# Patient Record
Sex: Male | Born: 1960 | Race: White | Hispanic: No | Marital: Married
Health system: Midwestern US, Academic
[De-identification: ages and names within clinical notes are randomized; demographics above are authoritative.]

---

## 2010-07-26 NOTE — Unmapped (Signed)
Signed by Alfonse Ras MD on 07/26/2010 at 09:13:52    Adult Visit      Reason for Visit   Chief Complaint: get established  cc hemorrhoids      Allergies  No Known Allergies    Medications   Medication list reviewed during this update.   Vital Signs:   Wt: 188 lbs.      BP: 122/74  Smoking Hx: non-smoker    Comments: r3    Intake recorded by: Larey Brick MA on Jul 26, 2010 9:02 AM    Medications reviewed, updated and verified with patient or patient representative.    History of Present Illness     General HPI   50 yo male co hemorrhoids use preparation H which has helped in the past   more recnelty has had pain w bm's, worse at starting   admits to beeding and some itching  no fever no weight loss   no abd pain   ow feels well     PAST HISTORY  Past Medical History:  No significant past medical history.  Surgical History:  No surgical history.  Family History: sister - colon cancer   m - cva   Social History: Marital Status: married,   Spouse: Tax adviser   Alcohol Use: none  Tobacco Usage:non-smoker  Lead Audiological scientist  PhD Patent attorney   originally from [place] BS and MS from [place], PhD from [employer]     REVIEW OF SYSTEMS  Refer to HPI for Review of Systems documentation        Physical Exam   General Appearance: well-developed, well-nourished and in no acute distress    Gastrointestinal   Abdomen: soft, non-tender, non-distended, no masses,  bowel sounds present, no rebound tenderness, no guarding, no hepatosplenomegaly  Rectal: no masses or tenderness     Neurologic   Neurologic - General:  Tone intact, steady gait, awake and alert.    Psyschiatric   Mood and affect: no agitation, good eye contact with appropriate insight and judgment        Assessment   HEMORRHOIDS (ICD-455.6)  NEOPLASM, MALIGNANT, COLON, FAMILY HX (ICD-V16.0)  SCREENING, COLON CANCER (ICD-V76.51)      Impression and Plan   Impression and Plan: hemorrhoids - none noted on exam but hx is suggestive  rx topical   refer for  colonoscopy as FH in sister of colon cancer   hm -return for CPE     Plan   Updated Medication List:   ANUSOL-HC 2.5 % CREAM (HYDROCORTISONE (RECTAL)) apply to rectum two to three times day as needed  COLACE 100 MG CAPS (DOCUSATE SODIUM) one by mouth twice a day    New Orders:  [facility]aron[facility] - Patient Encounter Documented Using an EHR Certified by [employer]  [phone] - Current Medications with Name, Dosages, Frequency and Route Documented [*CPT-G8427]  3016F - Unhealthy Alcohol Use Screening Performed [*CPT-3016F]  60454 - Ofc Vst, New Level III [CPT-99203]  Colonoscopy [UCP-11111]    Disposition:  Return to clinic in 3 month(s)        Prescriptions:  COLACE 100 MG CAPS (DOCUSATE SODIUM) one by mouth twice a day  #60 x 2   Entered and Authorized by: Alfonse Ras MD   Signed by: Alfonse Ras MD on 07/26/2010   Method used: Print then Give to Patient   RxID: [id number]  ANUSOL-HC 2.5 % CREAM (HYDROCORTISONE (RECTAL)) apply to rectum two to three times day as needed  #1  tube x 5   Entered and Authorized by: Alfonse Ras MD   Signed by: Alfonse Ras MD on 07/26/2010   Method used: Print then Give to Patient   RxID: [id number]                          ]

## 2010-12-14 ENCOUNTER — Ambulatory Visit: Admit: 2010-12-14 | Payer: PRIVATE HEALTH INSURANCE

## 2010-12-14 DIAGNOSIS — Z0000 Encounter for general adult medical examination without abnormal findings: Secondary | ICD-10-CM

## 2010-12-14 LAB — COMPREHENSIVE METABOLIC PANEL
A/G Ratio: 1.5 (ref 1.1–2.5)
ALT: 18 IU/L (ref 0–55)
AST: 25 IU/L (ref 0–40)
Albumin: 4.4 g/dL (ref 3.5–5.5)
Alkaline Phosphatase: 75 IU/L (ref 25–150)
BUN/Creatinine Ratio: 9 (ref 9–20)
BUN: 8 mg/dL (ref 6–24)
CO2: 20 mmol/L (ref 20–32)
Calcium: 9.5 mg/dL (ref 8.7–10.2)
Chloride: 102 mmol/L (ref 97–108)
Creatinine: 0.88 mg/dL (ref 0.76–1.27)
GFR MDRD Af Amer: 116 mL/min/{1.73_m2} (ref >59)
Globulin, Total: 3 g/dL (ref 1.5–4.5)
Glucose: 87 mg/dL (ref 65–99)
Potassium: 4.4 mmol/L (ref 3.5–5.2)
Sodium: 139 mmol/L (ref 134–144)
Total Bilirubin: 0.5 mg/dL (ref 0.0–1.2)
Total Protein: 7.4 g/dL (ref 6.0–8.5)
eGFR Non-Afr. American: 100 mL/min/{1.73_m2} (ref >59)

## 2010-12-14 LAB — CBC AND DIFFERENTIAL
Basophils Absolute: 0 10*3/uL (ref 0.0–0.2)
Basophils Relative: 1 % (ref 0–3)
Eosinophils Absolute: 0.6 10*3/uL (ref 0.0–0.4)
Eosinophils Relative: 7 % (ref 0–7)
Hematocrit: 44.2 % (ref 37.5–51.0)
Hemoglobin: 15 g/dL (ref 12.6–17.7)
Immature Granulocytes Absolute: 0 10*3/uL (ref 0.0–0.1)
Immature Granulocytes: 0 % (ref 0–2)
Lymphocytes Absolute: 2.3 10*3/uL (ref 0.7–4.5)
Lymphocytes Relative: 29 % (ref 14–46)
MCH: 29.2 pg (ref 26.6–33.0)
MCHC: 33.9 g/dL (ref 31.5–35.7)
MCV: 86 fL (ref 79–97)
Monocytes Absolute: 0.5 10*3/uL (ref 0.1–1.0)
Monocytes Relative: 7 % (ref 4–13)
Neutrophils Absolute: 4.4 10*3/uL (ref 1.8–7.8)
Neutrophils Relative: 56 % (ref 40–74)
Platelets: 212 10*3/uL (ref 140–415)
RBC: 5.13 x10E6/uL (ref 4.14–5.80)
RDW: 14.7 % (ref 12.3–15.4)
WBC: 7.8 10*3/uL (ref 4.0–10.5)

## 2010-12-14 LAB — URINALYSIS W/RFL TO MICROSCOPIC
Bilirubin, UA: NEGATIVE
Glucose, UA: NEGATIVE
Ketones, UA: NEGATIVE
Leukocytes, UA: NEGATIVE
Nitrite Urine, Quantitative: NEGATIVE
Protein, UA: NEGATIVE
RBC, UA: NEGATIVE
Specific Gravity, UA: 1.016 (ref 1.005–1.030)
Urobilinogen, Ur: 0.2 mg/dL (ref 0.0–1.9)
pH, UA: 7.5 (ref 5.0–7.5)

## 2010-12-14 LAB — PSA TOTAL, DIAGNOSTIC: PSA: 0.6 ng/mL (ref 0.0–4.0)

## 2010-12-14 LAB — TSH: TSH: 3.7 u[IU]/mL (ref 0.450–4.500)

## 2010-12-14 LAB — LIPID PANEL
Cholesterol, Total: 207 mg/dL (ref 100–199)
HDL: 45 mg/dL (ref >39)
LDL Calculated: 137 mg/dL (ref 0–99)
Triglycerides: 125 mg/dL (ref 0–149)
VLDL Cholesterol Cal: 25 mg/dL (ref 5–40)

## 2010-12-14 NOTE — Unmapped (Signed)
Subjective  HPI:   Patient ID: [name] is a 50 y.o. male.    Chief Complaint:  HPI       Well adult - cpe   HM - referred for colonoscopy as FH in sister of colon cancer   Refer again today   cpe completed 12/14/10  Refused flu shot   tdap today   Check labs      PAST HISTORY   Past Medical History: No significant past medical history.   Surgical History: No surgical history.   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 No distress.   HENT:   Head: Normocephalic and atraumatic.   Eyes: Conjunctivae and EOM are normal. Right eye exhibits no discharge. Left eye exhibits no discharge. No scleral icterus.   Pulmonary/Chest: Effort normal. No respiratory distress.   Neurological: He is alert. He exhibits normal muscle tone. Coordination normal.   Skin: Skin is warm and dry. He is not diaphoretic.   Psychiatric: He has a normal mood and affect. His behavior is normal. Thought content normal.   Gen - awake and alert, NAD   HENT - atraumatic, NC, tm's clear bilat canals clear op clear mmm   Eyes - PERRL EOMI anicteric sclera no conj injection no d/c no lid lesions  Neck - trachea midline, thyroid not enlarged, no masses, supple  Pul - cta b no wob symm bs good am throughout   CV - RRR no m/r/g 2+ radial pulses bilat no carotid bruits no abd bruits or tenderness   No c/c/e  abd - +bs s nt nd no hsm no rt/g  Lymph - no cervical supraclavicular axillary femoral or inguinal adenopathy   Derm  - turgor intact, no jaundice no concerning lesions   Psych - Good eye contact, no agitation good insight and judgement  Neuro - Awake and alert, tone intact, gait  intact, CN 2 - 12 intact grip 5/5     Filed Vitals:    12/14/10 1153   BP: 118/70   Height: 5' 11 (1.803 m)   Weight: 204 lb 12.8 oz (92.897 kg)     Body mass index is 28.56 kg/(m^2).  Body surface area is 2.16 meters squared.       Assessment/Plan:       Well adult - cpe   Left plantar fasciitis - educated on nsaids aleve one po bid fo rten to 14 days and stretch   HM - referred for colonoscopy as FH in sister of colon cancer didn't go   Refer again today   cpe completed 12/14/10  Refused flu shot   tdap today   Check labs

## 2010-12-15 NOTE — Unmapped (Signed)
Pt is calling because RX's were not called in yesterday.

## 2010-12-15 NOTE — Unmapped (Signed)
What meds?

## 2010-12-16 NOTE — Unmapped (Signed)
Please call this patient to schedule a/an colonoscopy

## 2010-12-16 NOTE — Unmapped (Signed)
Patient thought something was being sent.

## 2010-12-17 NOTE — Unmapped (Signed)
Wants to set this up with Dr Mickie Bail, was unsure about dates and times - advised to get some dates together, secure a ride and speak to [place] on Monday.

## 2010-12-21 NOTE — Unmapped (Signed)
Pt would like to confirm his appt for Colonscopy at [facility]. Please contact pt

## 2010-12-30 ENCOUNTER — Inpatient Hospital Stay

## 2010-12-30 NOTE — Unmapped (Signed)
[facility]     PATIENT NAMEDMARIO, [name]                  MR #:  [id number]  DATE OF BIRTH:  [birth year]                        ACCOUNT #:  [id number]  PHYSICIAN:      Lurene Shadow, M.D.          ROOM #:  SDS  SERVICE:        Gastroenterology                  NURSING UNIT:  [facility]  PRIMARY:        Gerrit Friends. Olivia Mackie, M.D.         FCSalena Saner  REFERRING:      Lurene Shadow, M.D.          ADMIT DATE:  12/30/2010  DICTATED BY:    Lurene Shadow, M.D.          PROCEDURE DATE:  12/30/2010                                                    DISCHARGE DATE:                                    ENDOSCOPY REPORT     *-*-*     PROCEDURES PERFORMED:     1.  Colonoscopy.     INDICATIONS:  Family history of colon cancer in a sister.     ANESTHESIA:  Sedation was administered using 3 mg of IV midazolam and 50 mcg  of IV fentanyl.     DETAILS OF PROCEDURE:  Colonoscope was advanced from the anus as far as the  terminal ileum.  The quality of the bowel preparation was good.  There is no  evidence of any proctitis, colitis, ileitis, diverticulosis, vascular  lesions, or any neoplasia.  He does have small internal hemorrhoids.     IMPRESSION:     1.  Normal colonoscopy.  We will plan repeat examination in five years    because of his family history.        *-*-*                                              _______________________________________  NS/tmg                                 _____  D:  12/30/2010 11:53                   Lurene Shadow, M.D.  T:  12/31/2010 05:05  Job #:  [id number]     c:   Gerrit Friends. Olivia Mackie, M.D.                                     ENDOSCOPY REPORT  PAGE    1 of   1

## 2014-09-26 NOTE — Unmapped (Signed)
Pre-Procedure Instructions    We???re pleased that you have chosen [facility] for your upcoming procedure.  The staff serving you is professionally trained to provide the highest quality care.  We encourage you to ask questions and to let the staff know your special needs.  We want your visit to be as comfortable as possible.    Your surgery is scheduled on 7/11.  Please arrive at 0715 AM and check in at the surgery waiting room on the second floor of the main hospital.    Directions to Surgery Area- do not stop at [employer] on Main Floor    1) Main entrance of the hospital to the elevators on the left (across from the gift shop)  2) Elevator to the second floor.  3) Left off elevator to the Surgical Services Desk.    STOP NSAIDS (non-steroidal anti-inflammatories such as Ibuprofen, Advil and Naproxen), SUPPLEMENTS, FISH OIL, VITAMINS, AND HERBAL SUPPLEMENTS ONE WEEK PRIOR TO SURGERY.  ACETAMINOPHEN (TYLENOL) IS OK.    TAKE YOUR ACID REFLUX, SEIZURE, ANXIETY, DEPRESSION AND PAIN MEDICATIONS, WITH A SMALL SIP OF WATER, DAY OF SURGERY, AS PRESCRIBED BY YOUR PHYSICIANS.    ??? DO NOT EAT OR DRINK ANYTHING (including gum, mints, water, etc.) after midnight the night before your procedure.  You may brush your teeth and gargle on the morning of surgery, but do not swallow any water.    ??? Please make transportation arrangements and bring a responsible adult to accompany you home and remain with you for 24 hours.  ??? We recommend that you leave valuables (i.e. money, jewelry, credit cards) at home.  If you wear glasses or contacts, bring a case for safekeeping.  ??? Wear casual, loose fitting, and comfortable clothing.  A gown will be provided.  If you are staying overnight, bring a small overnight bag.  (Storage space is limited.)  ??? Please remove all makeup, jewelry, body piercings, powder, lotions, perfume/cologne, and nail polish before you arrive.  ??? Bring a list of your medications and dose including herbal.  Do  not bring any pills or medications to the hospital. (Exception: transplant patients.)  ??? Bring a photo ID and your insurance card so we can bill your insurance company directly.  ??? Please do not bring any children under the age of 15 to the hospital.  ??? Do not shave in the area of the surgery for 2 days prior to surgery.  If needed, a trained staff member will clip the area immediately before your surgery.  ??? Quit smoking as far in advance of surgery as possible.  Patients who quit at least 30 days before surgery may have better outcomes.  ??? If you are diabetic, pay close attention to your blood sugar and try to keep it in the range your doctor wants it to be in.    ??? Talk to your doctor about taking medication such as Aspirin, Plavix, Pradaxa or Coumadin before surgery.  ??? If you have a cold or are sick prior to surgery, contact your surgeon before surgery.  ??? Please shower at home the evening before and the morning of surgery using an antibacterial soap, such as Dial or Safeguard.    Special Instructions    1. Please bring Inhalers the day of surgery.  2. Please bring CPAP machine and all associated equipment to the hospital the day of surgery      Antibacterial showering and good hand hygiene are essential to prevent surgical site infections  and reduce the spread of MRSA.  Please take a shower the morning of surgery using an antibacterial soap.  Patient verbalized understanding of these instructions.    Make sure all of your health care givers are checking your ID bracelet and verifying your name and date of birth.  You will actively be involved in verifying the type of surgery you are having and the correct site.  Your health care givers should be cleaning their hands with soap and water or antibacterial foam before taking care of you and if they do not it is ok to remind them to do so.      In an effort to reduce the risks of blood clots after your surgery you will have compression sleeves on your lower legs.   These sleeves help facilitate circulation and decrease the chances of developing any blood clots.       Patient/Family provided education about surgical site infection prevention.    Contact information:    [facility] Pre-admission Testing,  Monday - Friday 8:00 am - 4:30 pm,   [phone].    If you need to reach someone outside of regular business hours regarding your surgery please call your surgeon or   [facility] Surgery at [phone].

## 2014-09-26 NOTE — Unmapped (Signed)
Patient states he will be going to an Urgent Care Facility or [facility] for his pre op H&P on Saturday 09/27/14. He said he will bring a paper copy of this DOS. He verbalized correct understanding of the importance of completion of his pre op H&P prior to his surgery on 09/29/14 at 0915 with Dr Lehman Prom scheduled at [facility]

## 2014-09-29 ENCOUNTER — Inpatient Hospital Stay: Admit: 2014-09-29 | Payer: PRIVATE HEALTH INSURANCE

## 2014-09-29 DIAGNOSIS — M25532 Pain in left wrist: Secondary | ICD-10-CM

## 2014-09-29 MED ORDER — propofol 10 mg/ml (DIPRIVAN) injection
10 | INTRAVENOUS | Status: AC | PRN
Start: 2014-09-29 — End: 2014-09-29
  Administered 2014-09-29 (×5): 10 via INTRAVENOUS
  Administered 2014-09-29: 13:00:00 50 via INTRAVENOUS

## 2014-09-29 MED ORDER — ropivacaine (PF) (NAROPIN) injection solution 0.5%
5 | Freq: Once | INTRAMUSCULAR | Status: AC
Start: 2014-09-29 — End: 2014-09-29
  Administered 2014-09-29: 13:00:00 150 mL via PERINEURAL

## 2014-09-29 MED ORDER — oxyCODONE (ROXICODONE) immediate release tablet 10 mg
5 | Freq: Once | ORAL | Status: AC
Start: 2014-09-29 — End: 2014-09-29

## 2014-09-29 MED ORDER — ceFAZolin (ANCEF) IVPB 2 g in D5W (duplex)
2 | Freq: Once | INTRAVENOUS | Status: AC
Start: 2014-09-29 — End: 2014-09-29
  Administered 2014-09-29: 13:00:00 2 g via INTRAVENOUS

## 2014-09-29 MED ORDER — propofol 10 mg/ml (DIPRIVAN) 10 mg/mL injection
10 | INTRAVENOUS | Status: AC
Start: 2014-09-29 — End: ?

## 2014-09-29 MED ORDER — oxyCODONE (ROXICODONE) immediate release tablet 5 mg
5 | Freq: Once | ORAL | Status: AC
Start: 2014-09-29 — End: 2014-09-29

## 2014-09-29 MED ORDER — lidocaine (PF) 2% (20 mg/mL) Soln 20 mg
20 | Freq: Once | INTRAMUSCULAR | Status: AC | PRN
Start: 2014-09-29 — End: 2014-09-29

## 2014-09-29 MED ORDER — midazolam (PF) (VERSED) injection 2 mg
1 | Freq: Once | INTRAMUSCULAR | Status: AC
Start: 2014-09-29 — End: 2014-09-29
  Administered 2014-09-29: 13:00:00 2 mg via INTRAVENOUS

## 2014-09-29 MED ORDER — ondansetron (ZOFRAN) 4 mg/2 mL injection 4 mg
4 | Freq: Three times a day (TID) | INTRAMUSCULAR | Status: AC | PRN
Start: 2014-09-29 — End: 2014-09-29

## 2014-09-29 MED ORDER — fentaNYL (SUBLIMAZE) injection 12.5 mcg
50 | INTRAMUSCULAR | Status: AC | PRN
Start: 2014-09-29 — End: 2014-09-29

## 2014-09-29 MED ORDER — lidocaine (PF) 20 mg/mL (2 %) Soln
20 | INTRAVENOUS | Status: AC | PRN
Start: 2014-09-29 — End: 2014-09-29
  Administered 2014-09-29: 13:00:00 50 via INTRAVENOUS

## 2014-09-29 MED ORDER — fentaNYL (SUBLIMAZE) injection 50 mcg
50 | INTRAMUSCULAR | Status: AC | PRN
Start: 2014-09-29 — End: 2014-09-29

## 2014-09-29 MED ORDER — nalOXone (NARCAN) injection 0.04 mg
0.4 | INTRAMUSCULAR | Status: AC | PRN
Start: 2014-09-29 — End: 2014-09-29

## 2014-09-29 MED ORDER — bupivacaine-epinephrine (PF) (SENSORCAINE) 0.5%-0.005 mg/mL injection Soln
INTRAMUSCULAR | Status: AC
Start: 2014-09-29 — End: 2014-09-29

## 2014-09-29 MED ORDER — propofol (DIPRIVAN) infusion 10 mg/mL
10 | INTRAVENOUS | Status: AC | PRN
Start: 2014-09-29 — End: 2014-09-29
  Administered 2014-09-29: 13:00:00 150 via INTRAVENOUS

## 2014-09-29 MED ORDER — lactated ringers infusion
INTRAVENOUS | Status: AC
Start: 2014-09-29 — End: 2014-09-29
  Administered 2014-09-29: 13:00:00 via INTRAVENOUS
  Administered 2014-09-29: 12:00:00 50 mL/h via INTRAVENOUS

## 2014-09-29 MED ORDER — sodium chloride, irrigation 0.9 % irrigation
0.9 | Status: AC | PRN
Start: 2014-09-29 — End: 2014-09-29
  Administered 2014-09-29: 14:00:00 1000

## 2014-09-29 MED ORDER — labetalol (NORMODYNE,TRANDATE) injection
5 | INTRAVENOUS | Status: AC | PRN
Start: 2014-09-29 — End: 2014-09-29
  Administered 2014-09-29 (×2): 5 via INTRAVENOUS

## 2014-09-29 MED ORDER — labetalol (NORMODYNE, TRANDATE) 20 mg/4 mL (5 mg/mL) Syrg
20 | INTRAVENOUS | Status: AC
Start: 2014-09-29 — End: ?

## 2014-09-29 MED ORDER — fentaNYL (SUBLIMAZE) injection 25 mcg
50 | INTRAMUSCULAR | Status: AC | PRN
Start: 2014-09-29 — End: 2014-09-29

## 2014-09-29 MED FILL — PROPOFOL 10 MG/ML INTRAVENOUS EMULSION: 10 10 mg/mL | INTRAVENOUS | Qty: 20

## 2014-09-29 MED FILL — NAROPIN (PF) 5 MG/ML (0.5 %) INJECTION SOLUTION: 5 5 mg/mL (0.5 %) | INTRAMUSCULAR | Qty: 30

## 2014-09-29 MED FILL — LABETALOL 20 MG/4 ML (5 MG/ML) INTRAVENOUS SYRINGE: 20 20 mg/4 mL (5 mg/mL) | INTRAVENOUS | Qty: 4

## 2014-09-29 MED FILL — BUPIVACAINE-EPINEPHRINE (PF) 0.5 %-1:200,000 INJECTION SOLUTION: INTRAMUSCULAR | Qty: 30

## 2014-09-29 MED FILL — MIDAZOLAM (PF) 1 MG/ML INJECTION SOLUTION: 1 1 mg/mL | INTRAMUSCULAR | Qty: 2

## 2014-09-29 MED FILL — CEFAZOLIN 2 GRAM/50 ML IN DEXTROSE (ISO-OSMOTIC) INTRAVENOUS PIGGYBACK: 2 2 gram/50 mL | INTRAVENOUS | Qty: 50

## 2014-09-29 MED FILL — LACTATED RINGERS INTRAVENOUS SOLUTION: 50.00 50.00 mL/hr | INTRAVENOUS | Qty: 1000

## 2014-09-29 MED FILL — PROPOFOL 10 MG/ML INTRAVENOUS EMULSION: 10 10 mg/mL | INTRAVENOUS | Qty: 40

## 2014-09-29 NOTE — Unmapped (Signed)
Anesthesia Transfer of Care Note    Patient: [name]  Procedure(s) Performed: Procedure(s):  LEFT DISTAL RADIUS OPEN REDUCTION INTERNAL FIXATION    Patient location: Same Day Surgery    Post pain: Adequate analgesia    Post assessment: no apparent anesthetic complications and tolerated procedure well    Post vital signs:    Filed Vitals:    09/29/14 1118   BP: 102/67   Pulse: 78   Temp: 97.9   Resp: 18   SpO2: 97%       Level of consciousness: Awake, alert, and oriented    Complications: None

## 2014-09-29 NOTE — Unmapped (Signed)
Pt. Discharged per wheelchair to exit.    Dressing: D&I    Discharge Pain level- 0 Patient states pain is tolerable.    Patient and family verbalized understanding of discharge instructions.      Filed Vitals:    09/29/14 1213   BP: 108/63   Pulse: 69   Temp:    Resp: 16   SpO2: 97%             I/O this shift:  In: 1190 [P.O.:240; I.V.:950]  Out: 5 [Blood:5]      [name]

## 2014-09-29 NOTE — Unmapped (Addendum)
Pt up to BR with assistance of his wife. Arm remains in a sling and is numb. NV check WNL except for sensation due to the block.    1150 Dr. Lehman Prom in to speak with pt and his wife.    1223 Pt and wife verbalize understanding of all disharge instructions. NV check remains WNL except for sensation.

## 2014-09-29 NOTE — Unmapped (Signed)
Patient brought H&P and films- all placed on chart. Wife to return 1100. Number on chart. 0815 to pacu for block. MELANIE SHAWN ROACH

## 2014-09-29 NOTE — Unmapped (Signed)
[facility] Hand Surgery Discharge Instructions  1.  Please keep your dressing clean and dry.  Do not remove or change dressing, it will be removed at your post-op visit with Dr. Lehman Prom.  If you had a nerve block prior to your procedure, you may discontinue the use of your sling once the nerve block as worn off.  We encourage you to move any fingers that are not included in the splint and try to open and close them into a fist.    2.  Pain and nausea medications as needed.    3. Elevate arm to help with swelling and pain.    4.  Follow up with DR. Foad in 10-14 days or as scheduled.  Call [phone] for questions or concerns, or to schedule follow up.    5.  Follow up with Hand Therapy in 3-5 days or as scheduled.      Instructions Following Anesthesia    You were given medicines that made you unconscious while a procedure was performed. For as long as 24 hours following this procedure, you may have:  ??? Dizziness, weakness, or drowsiness  ??? Sore throat or hoarseness. ??? Nausea or vomiting     For the first 24 hours following anesthesia:  ?? Have a responsible person with you at all times.  ?? Do not participate in any activities where you could become injured for the next 24 hours, or until you feel normal again.  ?? Do not drive a car or operate heavy machinery.  ?? Do not drink alcohol, take sleeping pills, or other medications that cause drowsiness.  ?? Do not sign important papers or make important decisions.  ?? Only take medicine that has been prescribed by your caregiver.  For pain, only take over-the-counter or prescribed medicines for pain, discomfort, or fever as directed by your caregiver.  ?? If you wear a CPAP/BiPAP device, please wear it anytime that you are resting or feel tired.  ?? You may resume a normal diet unless otherwise directed.  Vomiting may occur if you eat too soon.  When you can drink without vomiting, try water, juice, or soup.  Try solid foods if you feel little or no nausea.  ?? If you  have questions or problems that seem related to anesthesia, call the hospital [phone]) and ask for the anesthesiologist on call.    SEEK IMMEDIATE MEDICAL CARE IF:   ??? You develop a rash.  ??? You have chest pain.  ??? You continue to feel sick to your stomach (nauseated) or throw up (vomit). ??? You have difficulty breathing.  ??? You develop any allergic reactions.  ??? You are unable to urinate.       Peripheral Nerve Blocks  Your caregiver may have placed a nerve block in one of your arms or legs to reduce pain and discomfort. This is an injection of local anesthetic(s) and usually provides numbing pain relief up to 18 to 24 hours. You should notify your caregiver if you have any problems.  Be aware that you may lose feeling at and around the surgical area. If numbness happens, take proper measures to avoid injury to the affected extremity.  Do not stand up unassisted if you have a nerve block in your leg. Do not try to lift items if you have had a nerve block in your arm.  Please be careful when placing hot or cold items on a numb extremity.    SEEK IMMEDIATE MEDICAL CARE IF:  ??  You have redness, swelling, pain, or discharge at the injection site.  ?? You develop dizziness, lightheadedness, drowsiness, or confusion.  ?? There is a ringing or buzzing in your ears or blurred vision.  ?? You have a metal taste or numbness/tingling in or around your mouth.  ?? If you have continued numbness or weakness after 24 hours, please contact [place] and ask for the Pain Service to be paged [phone]).

## 2014-09-29 NOTE — Unmapped (Signed)
Anesthesia Post Note    Patient: [name]    Procedure(s) Performed: Procedure(s):  LEFT DISTAL RADIUS OPEN REDUCTION INTERNAL FIXATION    Anesthesia type: regional    Patient location: PACU    Post pain: Adequate analgesia    Post assessment: no apparent anesthetic complications and tolerated procedure well    Last Vitals:   Filed Vitals:    09/29/14 1213   BP: 108/63   Pulse: 69   Temp:    Resp: 16   SpO2: 97%       Post vital signs: stable    Level of consciousness: awake    Complications: None

## 2014-09-29 NOTE — Unmapped (Signed)
[place]  DEPARTMENT OF ANESTHESIOLOGY  PRE-PROCEDURAL EVALUATION    [name] is a 54 y.o. year old male presenting for:    Procedure(s):  LEFT DISTAL RADIUS OPEN REDUCTION INTERNAL FIXATION    Surgeon:   Arrie Senate, MD    Chief Complaint     LEFT DISTAL RADIUS FRACT URE    Review of Systems     Anesthesia Evaluation    Patient summary reviewed.  All other systems reviewed and are negative.     No history of anesthetic complications   I have reviewed the History and Physical Exam, any relevant changes are noted in the anesthesia pre-operative evaluation.      Cardiovascular:      (-) hypertension, past MI, CAD, angina.    Neuro/Muscoloskeletal/Psych:      (-) seizures, CVA.     Pulmonary:      (-) COPD, asthma, shortness of breath, recent URI, sleep apnea.       GI/Hepatic/Renal:      (-) GERD, liver disease, renal disease.    Endo/Other:        (-) diabetes mellitus, hypothyroidism, hyperthyroidism.       Past Medical History     Past Medical History   Diagnosis Date   ??? Seasonal allergies        Past Surgical History     History reviewed. No pertinent past surgical history.    Family History     Family History   Problem Relation Age of Onset   ??? Stroke Mother    ??? Colon cancer Sister        Social History     History     Social History   ??? Marital Status: Married     Spouse Name: N/A   ??? Number of Children: N/A   ??? Years of Education: N/A     Occupational History   ??? Not on file.     Social History Main Topics   ??? Smoking status: Never Smoker    ??? Smokeless tobacco: Never Used      Comment: 07/26/2010   ??? Alcohol Use: No      Comment: 07/26/2010   ??? Drug Use: No   ??? Sexual Activity: Not on file     Other Topics Concern   ??? Not on file     Social History Narrative       Medications     Allergies:  No Known Allergies    Home Meds:  Prior to Admission medications as of 09/29/14 0730   Medication Sig Taking?   DEXTRAN 70/HYPROMELLOSE/PF (NATURAL TEARS, PF, OPHT) Apply to eye. Yes   multivitamin (THERAGRAN)  tablet Take 1 tablet by mouth daily. Yes   docusate sodium (COLACE) 100 MG capsule Take 100 mg by mouth 2 times a day.      hydrocortisone (ANUSOL-HC) 2.5 % rectal cream Place rectally 2 times a day as needed. Apply to rectum two to three times days as needed         Inpatient Meds:  Scheduled:   ??? ceFAZolin (ANCEF) IVPB  2 g Intravenous Once   ??? midazolam  2 mg Intravenous Once   ??? ropivacaine (PF) 0.5%  30 mL PERINEURAL Once     Continuous:   ??? lactated ringers         PRN: lidocaine (PF) 2% (20 mg/mL)    Vital Signs     Wt Readings from Last 3 Encounters:   12/14/10 204  lb 12.8 oz (92.897 kg)   07/26/10 188 lb (85.276 kg)     Ht Readings from Last 3 Encounters:   12/14/10 5' 11 (1.803 m)     Temp Readings from Last 3 Encounters:   No data found for Temp     BP Readings from Last 3 Encounters:   12/14/10 118/70   07/26/10 122/74     Pulse Readings from Last 3 Encounters:   No data found for Pulse     SpO2 Readings from Last 3 Encounters:   No data found for SpO2       Physical Exam     Airway:     Mallampati: II  Mouth Opening: >2 FB  TM distance: > = 3 FB  Neck ROM: full  (-) neck not short     (+) facial hair    Dental:   - No obvious cracked, loose, chipped, or missing teeth.     Pulmonary:   - normal exam     Cardiovascular:     Rhythm: regular  Rate: normal    Neuro/Musculoskeletal/Psych:    Mental status: alert and oriented to person, place and time.          Abdominal:    - normal exam    Current OB Status:       Other Findings:        Laboratory Data     Lab Results   Component Value Date    WBC 7.8 12/14/2010    HGB 15.0 12/14/2010    HCT 44.2 12/14/2010    MCV 86 12/14/2010    PLT 212 12/14/2010       No results found for: [facility]    Lab Results   Component Value Date    GLUCOSE 87 12/14/2010    BUN 8 12/14/2010    CO2 20 12/14/2010    CREATININE 0.88 12/14/2010    K 4.4 12/14/2010    NA 139 12/14/2010    CL 102 12/14/2010    CALCIUM 9.5 12/14/2010    ALBUMIN 4.4 12/14/2010    PROT 7.4 12/14/2010     ALKPHOS 75 12/14/2010    ALT 18 12/14/2010    AST 25 12/14/2010    BILITOT 0.5 12/14/2010       No results found for: PTT, INR    No results found for: PREGTESTUR, PREGSERUM, HCG, HCGQUANT    Anesthesia Plan     ASA 1     Anesthesia Type:  regional.   (Regional w/ MAC, PIV, standard monitors, IV opioids as needed, PACU post op)  Intravenous induction.    Anesthetic plan and risks discussed with patient.    Plan, alternatives, and risks of anesthesia, including death, have been explained to and discussed with the patient/legal guardian.  By my assessment, the patient/legal guardian understands and agrees.  Scenario presented in detail.  Questions answered.    Blood products not discussed.  Plan discussed with CRNA.

## 2014-09-29 NOTE — Unmapped (Signed)
Block    Block Reason:  at surgeon's request and primary anesthetic.  Diagnosis:  Left wrist pain.  Requesting Physician/Surgeon: Foad  Patient location during procedure: PACU  Performed preoperatively.  Block Type:  brachial plexus, supraclavicular.                  Preanesthetic Checklist  Completed: patient identified, anesthesia consent given, pre-op evaluation, timeout performed, IV checked, risks and benefits discussed, monitors and equipment checked/attached to patient and patient being monitored  Anesthesia risks / alternatives discussed pre-op  Questions answered / anesthesia plan accepted and patient agrees to proceed..  Patient Position:  sitting.  Timeout performed at:  08:36. By:  Doreene Burke   Timeout verification:  correct patient, correct procedure, correct site and allergies reviewed.    Prep: ChloraPrep     Pre-procedure sedation:  Midazolam    Midazolam 2 mg given for pre procedure sedation.  Pre procedure sedation medication given by:  Doreene Burke    Start time: 09/29/2014 8:37 AM    Skin infiltrated with ropivacaine 0.5% prior to the start of the procedure.    Needles  Injection technique: single shot  Needle: Nerve Stimulating Needle 22 G 2 in.    Procedures  Procedures: ultrasound guided        Narrative    Injection made incrementally with aspirations every 5 mL.  Medications:  Ropivacaine 0.5%    Amount:  30 mL      Events:  blood not aspirated via needle, injection not painful, no injection resistance, no paresthesia with needle placement and no other event                            Block Effect:  No Unexpected/Untoward Events and Successful      End time: 09/29/2014 8:42 AM                      Performed by: personally     Anesthesiologist: Allie Dimmer        Additional Notes  Please see nursing documentation for vital signs.

## 2014-09-29 NOTE — Unmapped (Signed)
Griffen Harville  [birth year]  @[facility] @  09/29/2014      Arrie Senate, MD     PREOPERATIVE DIAGNOSIS:  Left Distal Radius Fracture     POSTOPERATIVE DIAGNOSIS:  Same    PROCEDURE PERFORMED:    1.  Open Treatment of left distal radius intra-articular fracture with internal fixation of 3 or more fragments (CPT 25609.22)  2.  Left brachioradialis tenotomy.  Tenotomy, open flexor or extensor tendon, forearm and/or wrist, single, each tendon (CPT 16109)    SURGEON: Gennesis Hogland B. 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  The fall resulted in a left distal radius fracture with intraarticular extension and significant incongruity of the joint surface.  He was counseled regarding treatment options and elected to proceed with surgical intervention as described above.    OPERATIVE FINDINGS:  Pre-operative CT scan demonstrated a volar marginal lunate facet fragment which proved to be displaced and unstable.  This fragment had a significant amount of articular surface on it extending towards the radial side of the joint.  There was also an impacted radial styloid component.  The fracture was able to be reduced anatomically working through the fracture planes via a volar exposure.  Volar plate fixation with hook plate augmentation of the marginal fragment resulted in excellent stability.  Due to the location of the radial styloid fracture, only one good screw gained purchase via the volar plate, hence an additional radial column plate was placed to provide further support.  At the conclusion of the procedure there was excellent stability of the fracture and full range of forearm rotation with no instability.      IMPLANTS:   1.  [employer] Geminus volar plate, standard size, three hole with hook plate extension.  2.   Acumed radial styloid fragment specific plate    DESCRIPTION OF PROCEDURE:  After obtaining informed consent, the patient was taken to the operating room and given appropriate anesthesia.  Preoperative timeout was performed confirming the correct patient, operative site, procedure, and that IV antibiotics had been administered.  The involved hand and upper extremity were prepped and draped in the usual sterile fashion.  The limb was then exsanguinated with an esmarch bandage, and a well padded brachial tourniquet was inflated to appropriate pressure.    A standard volar approach to the distal radius was performed through the FCR sheath, retracting the FPL and exposing the pronator quadratus.  Care was taken to avoid injury to the palmar cutaneous branch of the median nerve during the dissection.  The pronator was released from it's radial and distal attachments preserving the volar radiocarpal ligaments.  Subperiosteal dissection was performed exposing the fracture.  It was disimpacted and mobilized.  As expected base on radiographs and CT, this was not the typical metaphyseal bending type fracture, rather had a significant articular shearing component.  Dissection was carried to the distal ulnar margin of the radius noting the volar marginal lunate facet fragment which was unstable and displaced.  The fragment was mobilized and an elevator placed under it.  It was found to comprise a significant articular fragment that extended well radial.  With distraction of the joint, the rest of the articular fragments were visualized, disimpacted, and further reduced while taking care not to damage the volar radiocarpal ligaments.   The volar marginal fragment was reduced and provisional K wire fixation placed.  The more central articular fragments were also reduced at this time.  The radial styloid component remained impacted and was not reducible via the current approach.      The brachioradialis appeared to be a  significant impediment to gaining requisite length and the ability to reduce the styloid component, thereby indicating the need for a brachioradialis tenotomy.  Dissection was carried radially, exposing the first compartment tendons, and the brachioradialis insertion on the floor of the first compartment.  It was then released from its insertion, significantly improving mobility of the distal fragment, and allowing it to be appropriately reduced.  The tenotomy was required to gain adequate reduction, not simply for surgical exposure.  With the styloid now mobile it was elevated with a freer to reduce the joint surface, then a pointed reduction forceps was used to align the cortical margin and bring the styloid fragment further volar.  An additional K wire was then used to stabilize the reduction.  Fluoroscopy was used to evaluate the reduction and K wire placement which were thought to be appropriate.    An appropriate size volar locking plate was provisionally pinned to the radius and adjusted using flouroscopy.  Once the position was appropriate, the sliding hole in the plate was filled with a non-locking screw.  The fracture was further reduced to the plate ensuring appropriate alignment of the articular components, then a series of distal locking screws were placed subchondrally supporting the articular surface and maintaining the reduction.  For the radial styloid screw, a guide wire with cannulated screw was used to maximize placement of the screw into the tip of the styloid.  Care was taken to ensure that the screws did not penetrate the dorsal cortex distally to avoid extensor tendon irritation.  An additional two screws were then placed in the shaft of the plate.  Due to the instability of the volar marginal fragment, a hook plate extension was now applied to the most distal and ulnar segment of the plate.  The drill guide was used to create pilot holes, then the hook was placed over the rim of the lunate  fossa, the plate secured to the volar plate, then locked with the locking cap.  The tab on the extension was broken, the provisional K wires were removed, and the reduction was noted to be stable.  Due to the limited fixation in the radial styloid, I proceeded to apply a radial column plate to provide additional support.    The radial artery was mobilized along the length of the surgical field, particularly where it passed beneath the first compartment tendons.  The ulnar margin of the first dorsal compartment retinaculum was incised as was part of the radial septum of the second compartment.  Soft tissue dissection was undertaken until adequate exposure of the radial styloid was present for radial column fixation.  Care was taken not to injure any superficial radial nerve branches during the dissection.  An Acumed radial styloid plate was now applied to the radial margin of the radius on the floor of the first compartment.  It was secured with two screws in the shaft and a single screw in the styloid as the most distal screw engaged the initial radial styloid screw in the volar plate.  Final fluoroscopic images were obtained confirming anatomic reduction, hardware placement and screw length, and excellent stability of the reduction.      The forearm was taken through and arc of motion and the DRUJ was found to be stable.  There was no crepitus or hardware impingement.  The carpus was stable on the radius as well.  The wound was thoroughly irrigated and hemostasis was obtained with a bipolar cautery.  The brachioradialis was z lengthened and used to cover the radial column plate providing a soft tissue bed for the first compartment tendons.  The pronator was repaired to the volar margin of the brachioradialis, followed by closure of the incision in a layered fashion.  A bulky short arm bandage was applied, followed by an appropriate splint.  The tourniquet was deflated with good circulatory return to the hand.       CONDITION ON DISCHARGE:  The patient was discharged home in satisfactory condition.    DISCHARGE SUMMARY:  The patient was discharged home with a prescription for appropriate oral analgesics, as well as instructions on digital range of motion exercises.  The patient will keep the arm elevated for the next several days.  The patient will be seen back in the office in 3-7 for a follow up visit and to begin protected range of motion.  They have my 24 hour number should the need to contact me arise.     The physician assistant???s presence was required to maintain appropriate limb position and provide necessary traction as well as other first assistant responsibilities during the procedure.  This was required to adequately complete the surgical procedure with as much safety and precision as possible.  Her skills and high level training in orthopedic procedures is above and beyond that of a typical surgical assistant, thus the physician assistant played an integral role in completing the procedure.     The difficulty of reducing and stabilizing this fracture cannot be overstated.  Significant articular shearing was present requiring meticulous reduction and stabilization of several articular components individually.  Not only was a volar plate required, but the additional use of a hook plate was needed as well as supplemental radial column fixation with a second plate.  The additional fixation methods alone required significantly more surgical time, effort, and skill than is usually encountered with typical distal radius fractures with articular involvement.  This also does not take into account the time, effort, and skill required to achieve fracture reduction prior to fixation.  The procedure required approximately twice as much time as usually required for three part articular fractures, hence I believe that the use of the .22 modifier is clearly justified in this setting for CPT code 54098.11      Laurence Spates, MD

## 2014-09-29 NOTE — Unmapped (Signed)
INTRA-OP POST BRIEFING NOTE: [name]      Specimens:     Prior to leaving the room: Nurse confirmed name of procedure, completion of instrument, sponge & needle counts, reads specimen labels aloud including patient name and addresses any equipment issues? Nurse confirmed wound class. Nurse to surgeon and anesthesia: What are key concerns for recovery and management of the patient?  Yes      Blood products stored at appropriate temperatures prior to return to blood bank (if applicable)? N/A      Other Comments:     Signed: Kristopher Glee    Date: 09/29/2014    Time: 10:53 AM

## 2014-09-29 NOTE — Unmapped (Signed)
Procedure Note    Anothy Bufano  09/29/2014      Pre-op Diagnosis: LEFT DISTAL RADIUS FRACTURE       Post-op Diagnosis: same    Procedure(s):  LEFT DISTAL RADIUS OPEN REDUCTION INTERNAL FIXATION with brachioradialis tenotomoy      Surgeon(s):  Arrie Senate, MD    Anesthesia: Regional    Staff:   Circulator: Kristopher Glee, RN  Physician Assistant: Lorne Skeens, PA-C  Scrub Person: TODD, REBECCA  Assistant: Candida Peeling, CSA  PCA: Kathryne Sharper    Estimated Blood Loss: Minimal                 Specimens: * No specimens in log *           Implants:  Geminus Distal Radius Standard Volar Plate, Acumed radial styloid gold plate          There were no complications unless listed below.        Florence Canner. Dominic Pea, PA-C    Date: 09/29/2014  Time: 11:16 AM

## 2018-04-12 ENCOUNTER — Emergency Department (HOSPITAL_COMMUNITY)
Admission: EM | Admit: 2018-04-12 | Discharge: 2018-04-13 | Disposition: A | Discharge status: Home / Self Care | Payer: 59 | Attending: Emergency Medicine | Admitting: Emergency Medicine

## 2018-04-12 DIAGNOSIS — R0602 Shortness of breath: Secondary | ICD-10-CM | POA: Insufficient documentation

## 2018-04-12 NOTE — ED Triage Notes (Signed)
Pt arrives with EMS, with c/o numbness in the bottom of his (both)  feet and sob,  cbg 147. No diabetes. Alert, oriented. 170/110 improved to 160/80. EKG WNL

## 2018-04-13 ENCOUNTER — Emergency Department (HOSPITAL_COMMUNITY): Payer: 59

## 2018-04-13 ENCOUNTER — Encounter (HOSPITAL_COMMUNITY): Payer: Self-pay | Admitting: *Deleted

## 2018-04-13 LAB — BASIC METABOLIC PANEL
Anion gap: 10 (ref 5–15)
BUN: 14 mg/dL (ref 6–20)
CHLORIDE: 107 mmol/L (ref 98–111)
CO2: 22 mmol/L (ref 22–32)
Calcium: 9.1 mg/dL (ref 8.9–10.3)
Creatinine, Ser: 0.92 mg/dL (ref 0.61–1.24)
GFR calc Af Amer: >60 mL/min (ref >60)
GFR calc non Af Amer: >60 mL/min (ref >60)
Glucose, Bld: 123 mg/dL — ABNORMAL HIGH (ref 70–99)
Potassium: 3.9 mmol/L (ref 3.5–5.1)
Sodium: 139 mmol/L (ref 135–145)

## 2018-04-13 LAB — CBC
HEMATOCRIT: 42.5 % (ref 39.0–52.0)
Hemoglobin: 13.8 g/dL (ref 13.0–17.0)
MCH: 28.9 pg (ref 26.0–34.0)
MCHC: 32.5 g/dL (ref 30.0–36.0)
MCV: 89.1 fL (ref 80.0–100.0)
Platelets: 245 10*3/uL (ref 150–400)
RBC: 4.77 MIL/uL (ref 4.22–5.81)
RDW: 14.4 % (ref 11.5–15.5)
WBC: 9.9 10*3/uL (ref 4.0–10.5)
nRBC: 0 % (ref 0.0–0.2)

## 2018-04-13 LAB — I-STAT TROPONIN, ED: Troponin i, poc: 0 ng/mL (ref 0.00–0.08)

## 2018-04-13 MED ORDER — SODIUM CHLORIDE 0.9% FLUSH: 3.0000 mL | Freq: Once | INTRAVENOUS | Status: DC

## 2018-04-13 MED ORDER — IPRATROPIUM-ALBUTEROL 0.5-2.5 (3) MG/3ML IN SOLN
3.0000 mL | Freq: Once | RESPIRATORY_TRACT | Status: AC
  Administered 2018-04-13: 3 mL via RESPIRATORY_TRACT
  Filled 2018-04-13: qty 3

## 2018-04-13 MED ORDER — DEXAMETHASONE 4 MG PO TABS
10.0000 mg | ORAL_TABLET | Freq: Once | ORAL | Status: AC
  Administered 2018-04-13: 10 mg via ORAL
  Filled 2018-04-13: qty 3

## 2018-04-13 MED ORDER — ALBUTEROL SULFATE HFA 108 (90 BASE) MCG/ACT IN AERS
2.0000 | INHALATION_SPRAY | RESPIRATORY_TRACT | Status: DC | PRN
  Administered 2018-04-13: 2 via RESPIRATORY_TRACT
  Filled 2018-04-13: qty 6.7

## 2018-04-13 NOTE — Discharge Instructions (Addendum)
Use the inhaler - two puffs, every four hours, as needed for difficulty breathing.  Return if symptoms are getting worse.

## 2018-04-13 NOTE — ED Triage Notes (Signed)
Pt reports that he has had intermittent shortness of breath. Some heaviness in his chest and tingling on the bottom of his feet. He is traveling (plane ride) from [place] on Wednesday. Denies swelling in legs.

## 2018-04-13 NOTE — ED Provider Notes (Signed)
[facility] EMERGENCY DEPARTMENT Provider Note   CSN: [id number] Arrival date & time: 04/12/18  2346     History   Chief Complaint Chief Complaint  Patient presents with  . Shortness of Breath    HPI Edwin Ayala is a 58 y.o. male.  The history is provided by the patient.  He has no significant past history, and noted an episode of dyspnea when he went to bed tonight.  He felt short of breath and this was followed by some numbness in his feet and a feeling of lightheadedness.  There was a dry mouth.  He did not notice any numbness in his hands or lips.  Episode lasted about 30 minutes before resolving.  He has had several other episodes through the evening, but they are not lasting very long.  He denies chest pain, heaviness, tightness, pressure.  He denies cough or fever.  He is in [place] on business having traveled here from [place] 3 days ago.  No history of long distance travel.  He relates a history of borderline blood pressure and borderline cholesterol but no diagnosis of hypertension or hyperlipidemia, no history of diabetes.  He is a non-smoker and there is no family history of premature coronary atherosclerosis.  History reviewed. No pertinent past medical history.  There are no active problems to display for this patient.   History reviewed. No pertinent surgical history.      Home Medications    Prior to Admission medications   Not on File    Family History No family history on file.  Social History Social History   Tobacco Use  . Smoking status: Never Smoker  Substance Use Topics  . Alcohol use: Never    Frequency: Never  . Drug use: Never     Allergies   Patient has no allergy information on record.   Review of Systems Review of Systems  All other systems reviewed and are negative.    Physical Exam Updated Vital Signs BP 138/72   Pulse 70   Temp 98.3 F (36.8 C) (Oral)   Resp 18   SpO2 97%   Physical  Exam Vitals signs and nursing note reviewed.    58 year old male, resting comfortably and in no acute distress. Vital signs are normal. Oxygen saturation is 97%, which is normal. Head is normocephalic and atraumatic. PERRLA, EOMI. Oropharynx is clear. Neck is nontender and supple without adenopathy or JVD. Back is nontender and there is no CVA tenderness. Lungs are clear without rales, wheezes, or rhonchi. Chest is nontender. Heart has regular rate and rhythm without murmur. Abdomen is soft, flat, nontender without masses or hepatosplenomegaly and peristalsis is normoactive. Extremities have no cyanosis or edema, full range of motion is present. Skin is warm and dry without rash. Neurologic: Mental status is normal, cranial nerves are intact, there are no motor or sensory deficits.  ED Treatments / Results  Labs (all labs ordered are listed, but only abnormal results are displayed) Labs Reviewed  BASIC METABOLIC PANEL - Abnormal; Notable for the following components:      Result Value   Glucose, Bld 123 (*)    All other components within normal limits  CBC  I-STAT TROPONIN, ED    EKG EKG Interpretation  Date/Time:  Friday April 13 2018 00:06:41 EST Ventricular Rate:  84 PR Interval:  146 QRS Duration: 88 QT Interval:  364 QTC Calculation: 430 R Axis:   38 Text Interpretation:  Normal sinus rhythm  Normal ECG No old tracing to compare Confirmed by Dione BoozeGlick, Chanin Frumkin ([id number]) on 04/13/2018 6:05:18 AM   Radiology Dg Chest 2 View  Result Date: 04/13/2018 CLINICAL DATA:  Shortness of breath, chest pressure beginning tonight. EXAM: CHEST - 2 VIEW COMPARISON:  None. FINDINGS: Cardiomediastinal silhouette is unremarkable for this low inspiratory examination with crowded vasculature markings. The lungs are clear without pleural effusions or focal consolidations. Bronchitic changes. Trachea projects midline and there is no pneumothorax. Included soft tissue planes and osseous structures  are non-suspicious. IMPRESSION: Bronchitic changes without focal consolidation. Electronically Signed   By: Awilda Metroourtnay  Bloomer M.D.   On: 04/13/2018 00:27    Procedures Procedures   Medications Ordered in ED Medications  sodium chloride flush (NS) 0.9 % injection 3 mL (3 mLs Intravenous Not Given 04/13/18 0548)  dexamethasone (DECADRON) tablet 10 mg (has no administration in time range)  albuterol (PROVENTIL HFA;VENTOLIN HFA) 108 (90 Base) MCG/ACT inhaler 2 puff (has no administration in time range)  ipratropium-albuterol (DUONEB) 0.5-2.5 (3) MG/3ML nebulizer solution 3 mL (3 mLs Nebulization Given 04/13/18 [id number])     Initial Impression / Assessment and Plan / ED Course  I have reviewed the triage vital signs and the nursing notes.  Pertinent labs & imaging results that were available during my care of the patient were reviewed by me and considered in my medical decision making (see chart for details).  Episodes of dyspnea of uncertain cause.  Clinical picture during these episodes suggests hyperventilation.  No cardiac risk factors.  Initial work-up is negative.  Will give therapeutic trial of albuterol with ipratropium.  [facility].  He states he feels significantly better after albuterol with ipratropium.  Is given a dose of dexamethasone and discharged with an albuterol inhaler to use every 4 hours as needed.  Return precautions discussed.  Final Clinical Impressions(s) / ED Diagnoses   Final diagnoses:  Shortness of breath    ED Discharge Orders    None       Dione BoozeGlick, Myeasha Ballowe, MD 04/13/18 [phone]

## 2018-09-03 NOTE — Unmapped (Signed)
LVM confirming pt to be coming in person on 6/18.

## 2018-09-06 ENCOUNTER — Ambulatory Visit: Admit: 2018-09-06 | Discharge: 2018-10-05 | Payer: PRIVATE HEALTH INSURANCE | Attending: Cardiovascular Disease

## 2018-09-06 DIAGNOSIS — R002 Palpitations: Secondary | ICD-10-CM

## 2018-09-06 NOTE — Unmapped (Signed)
[facility]    New patient    HPI:   58 yo male here with wife.  Episodes of headache at back of head with feeling of chest pressure, very mild, at times with palpitations. He also starts feeling very 'drained' and fatigued with this episode, and it lasts for 2-3 hours.  Usually at rest, like when he wakes up in the morning or after a meal or when he is about to go to bed.  Has been to ER for this at OSH or urgent care. He reports that they said he didn't have a heart attack but blood pressure was high and he was started on BP medication. Follows with PCP for that.  Brings home BP log from this month; all reading were in the teens, for SBP, completely normal.  Does relay that his episodes may be related to lack of sleep.    Reports that he has lost weight, about 20-25 lbs over the last 3 months.      Past Medical History:   Diagnosis Date   ??? Hemorrhoid    ??? Seasonal allergies        Allergies   Allergen Reactions   ??? Atropine Other (See Comments)     Eye drops- make eyes have swelling         Current Outpatient Medications on File Prior to Visit   Medication Sig Dispense Refill   ??? acetaminophen (TYLENOL) 325 MG tablet Take 325 mg by mouth every 6 hours as needed.     ??? DEXTRAN 70/HYPROMELLOSE/PF (NATURAL TEARS, PF, OPHT) Apply to eye.     ??? docusate sodium (COLACE) 100 MG capsule Take 100 mg by mouth 2 times a day.       ??? hydrocortisone (ANUSOL-HC) 2.5 % rectal cream Place rectally 2 times a day as needed. Apply to rectum two to three times days as needed      ??? multivitamin (THERAGRAN) tablet Take 1 tablet by mouth daily.     ??? naproxen sodium (ANAPROX) 220 MG tablet Take 220 mg by mouth 2 times a day with meals.       No current facility-administered medications on file prior to visit.        Family History   Problem Relation Age of Onset   ??? Stroke Mother    ??? Colon Cancer Sister        Social History     Socioeconomic History   ??? Marital status: Married     Spouse name: Not on file   ???  Number of children: Not on file   ??? Years of education: Not on file   ??? Highest education level: Not on file   Occupational History   ??? Not on file   Social Needs   ??? Financial resource strain: Not on file   ??? Food insecurity     Worry: Not on file     Inability: Not on file   ??? Transportation needs     Medical: Not on file     Non-medical: Not on file   Tobacco Use   ??? Smoking status: Never Smoker   ??? Smokeless tobacco: Never Used   ??? Tobacco comment: 07/26/2010   Substance and Sexual Activity   ??? Alcohol use: No     Comment: 07/26/2010   ??? Drug use: No   ??? Sexual activity: Not on file   Lifestyle   ??? Physical activity     Days per  week: Not on file     Minutes per session: Not on file   ??? Stress: Not on file   Relationships   ??? Social Wellsite geologist on phone: Not on file     Gets together: Not on file     Attends religious service: Not on file     Active member of club or organization: Not on file     Attends meetings of clubs or organizations: Not on file     Relationship status: Not on file   ??? Intimate partner violence     Fear of current or ex partner: Not on file     Emotionally abused: Not on file     Physically abused: Not on file     Forced sexual activity: Not on file   Other Topics Concern   ??? Caffeine Use Not Asked   ??? Occupational Exposure Not Asked   ??? Exercise Not Asked   ??? Seat Belt Not Asked   Social History Narrative   ??? Not on file       Review of Systems:  General:  No weight loss; No fever    Skin:  No itching; No rashes    Head/Eyes:  No headache; No visual changes    ENT:  No hearing changes; No nasal congestion    Respiratory:  No dyspnea; No cough    Cardiovascular:  No chest pain; No ankle edema    Gastrointestinal:  No nausea; No melena    Genitourinary:  No dysuria; No hematuria    Musculoskeletal:  No muscle weakness; No joint pain    Hematologic/Lymphatic:  No easy bruising; No blood transfusions     Endocrine:  No heat/cold intolerance; No polyuria    Neurologic:  No fainting;  No seizures    Psychiatric:  No anxiety; No depression    Allergy/Rheumatologic:  No seasonal allergies; No joint stiffness      Physical Examination:  Vitals:    09/06/18 1015   BP: 116/76   BP Location: Left arm   Patient Position: Sitting   BP Cuff Size: Large   Pulse: 61   Weight: 172 lb (78 kg)     Gen: Well nourished; Alert & oriented  Skin: no rashes and no abnormalities with palpation   Neck: no masses with normal motion and no evidence of an enlarged thyroid  Eyes: no evidence of visual defects and normal sclera  Ears: no abnormalities; Normal hearing  Mouth: normal teeth and appropriately moist mucous membranes  Lymph: No lymph node enlargements identified in the axilla, groin or neck  Chest: clear to auscultation. There is normal respiratory effort and expansion.   CV: examination showed a regular rhythm. S1 and S2 were normal. There were no murmurs or gallops noted. JVP was estimated as normal.   Abd: non tender abdomen without organomegaly and with normal bowel sounds.  Lower extremities: no edema and normal pulses bilaterally.  Upper extremities: equal pulses and normal form and function.   Carotids: no bruits.   Neurologic:Cranial nerves intact; Sensation normal; no localizing signs        ECG: NSR, nml ECG    A/P:   HA  Palpitations    Seems very anxious.   For frequent palpitations, will get event monitor.  If results are abnormal, can follow up here again. If within normal limits, can continue follow up with PCP.  He agrees with plan.    RTC as needed  Modena Nunnery, MD  Assistant Professor  Division of Cardiovascular Health and Disease  [facility] ??  Pager [phone]

## 2018-09-06 NOTE — Unmapped (Signed)
Spoke with patient and he would like his event monitor mailed out. Explained to patient that [employer] will call prior to shipping out the monitor. Also let patient know about the NOV (non office visit) that will show up in my chart. Explained that this is not an appointment that the patient has to be here for and that this is just a way for us to be able to put the report in the patient chart. Patient verbalized undertanding.  Enrolled online with[employer] to have monitor shipped to patient's house.

## 2018-09-06 NOTE — Unmapped (Signed)
Follow up with Dr. Narda Amber as needed.    Cardiac Event monitor- please call [phone] to set up monitoring.

## 2018-09-06 NOTE — Unmapped (Signed)
-----   Message from Mia Creek, RN sent at 09/06/2018 11:13 AM EDT -----  Patient needs cardiac event monitor.

## 2018-09-07 ENCOUNTER — Ambulatory Visit: Admit: 2018-09-07 | Payer: PRIVATE HEALTH INSURANCE

## 2018-09-07 DIAGNOSIS — I10 Essential (primary) hypertension: Secondary | ICD-10-CM

## 2018-09-07 LAB — COMPREHENSIVE METABOLIC PANEL
ALT: 10 U/L (ref 7–52)
AST: 17 U/L (ref 13–39)
Albumin: 4 g/dL (ref 3.5–5.7)
Alkaline Phosphatase: 66 U/L (ref 36–125)
Anion Gap: 8 mmol/L (ref 3–16)
BUN: 14 mg/dL (ref 7–25)
CO2: 28 mmol/L (ref 21–33)
Calcium: 9.3 mg/dL (ref 8.6–10.3)
Chloride: 105 mmol/L (ref 98–110)
Creatinine: 0.94 mg/dL (ref 0.60–1.30)
Glucose: 90 mg/dL (ref 70–100)
Osmolality, Calculated: 292 mOsm/kg (ref 278–305)
Potassium: 4 mmol/L (ref 3.5–5.3)
Sodium: 141 mmol/L (ref 133–146)
Total Bilirubin: 0.5 mg/dL (ref 0.0–1.5)
Total Protein: 7 g/dL (ref 6.4–8.9)
eGFR AA CKD-EPI: >90 See note.
eGFR NONAA CKD-EPI: 90 See note.

## 2018-09-07 LAB — THYROID FUNCTION CASCADE: TSH: 2.96 u[IU]/mL (ref 0.45–4.12)

## 2018-09-07 LAB — CBC
Hematocrit: 39.9 % (ref 38.5–50.0)
Hemoglobin: 13.5 g/dL (ref 13.2–17.1)
MCH: 29.9 pg (ref 27.0–33.0)
MCHC: 33.9 g/dL (ref 32.0–36.0)
MCV: 88.2 fL (ref 80.0–100.0)
MPV: 8.9 fL (ref 7.5–11.5)
Platelets: 230 10*3/uL (ref 140–400)
RBC: 4.52 10*6/uL (ref 4.20–5.80)
RDW: 14.4 % (ref 11.0–15.0)
WBC: 6.9 10*3/uL (ref 3.8–10.8)

## 2018-09-07 LAB — LIPID PANEL
Cholesterol, Total: 131 mg/dL (ref 0–200)
HDL: 47 mg/dL (ref 60–92)
LDL Cholesterol: 72 mg/dL
Triglycerides: 58 mg/dL (ref 10–149)

## 2018-09-07 LAB — C-REACTIVE PROTEIN: CRP: 5.5 mg/L (ref 1.0–10.0)

## 2018-09-07 LAB — SED RATE: Sed Rate: 20 mm/hr (ref 0–20)

## 2018-09-19 NOTE — Unmapped (Signed)
Pt called to inform MD of experiencing heart palpitations and lightheadedness and submitted transmission.Pt asked for MD to review data and reach back out to him to discuss

## 2018-09-19 NOTE — Unmapped (Signed)
Talked with patient    No episode seen on [employer] site  No episode sent as notification    Patient states feeling fine now but felt some palpitations a couple nights ago    Explained how we are notified by [employer]     Patient expressed understanding

## 2018-10-19 ENCOUNTER — Inpatient Hospital Stay: Admit: 2018-10-19 | Payer: PRIVATE HEALTH INSURANCE

## 2018-10-19 DIAGNOSIS — R002 Palpitations: Secondary | ICD-10-CM

## 2018-10-19 NOTE — Procedures (Signed)
AMBULATORY CARDIAC EVENT MONITOR  Ordering Physician:  Modena Nunnery, MD  Technician:  CJ      Indications:  palpitations    Date:    From: 09/17/2018 To: 10/16/2018    Quality of Tracing:  good     Basic Rhythm:  sinus    Events/Symptoms:  Patient complained of, fatigue, skipped beats, and lightheadedness.    CONCLUSION: NSR. Sinus tachycardia. All symptoms associated with NSR.    Interpreting MD:  Lionel December, MD

## 2020-05-13 IMAGING — CR DG CHEST 2V
2 series · 2 of 2 positions shown · non-contrast
Comparison: None.

CLINICAL DATA: Shortness of breath, chest pressure beginning
tonight.

EXAM:
CHEST - 2 VIEW

[chest pa]
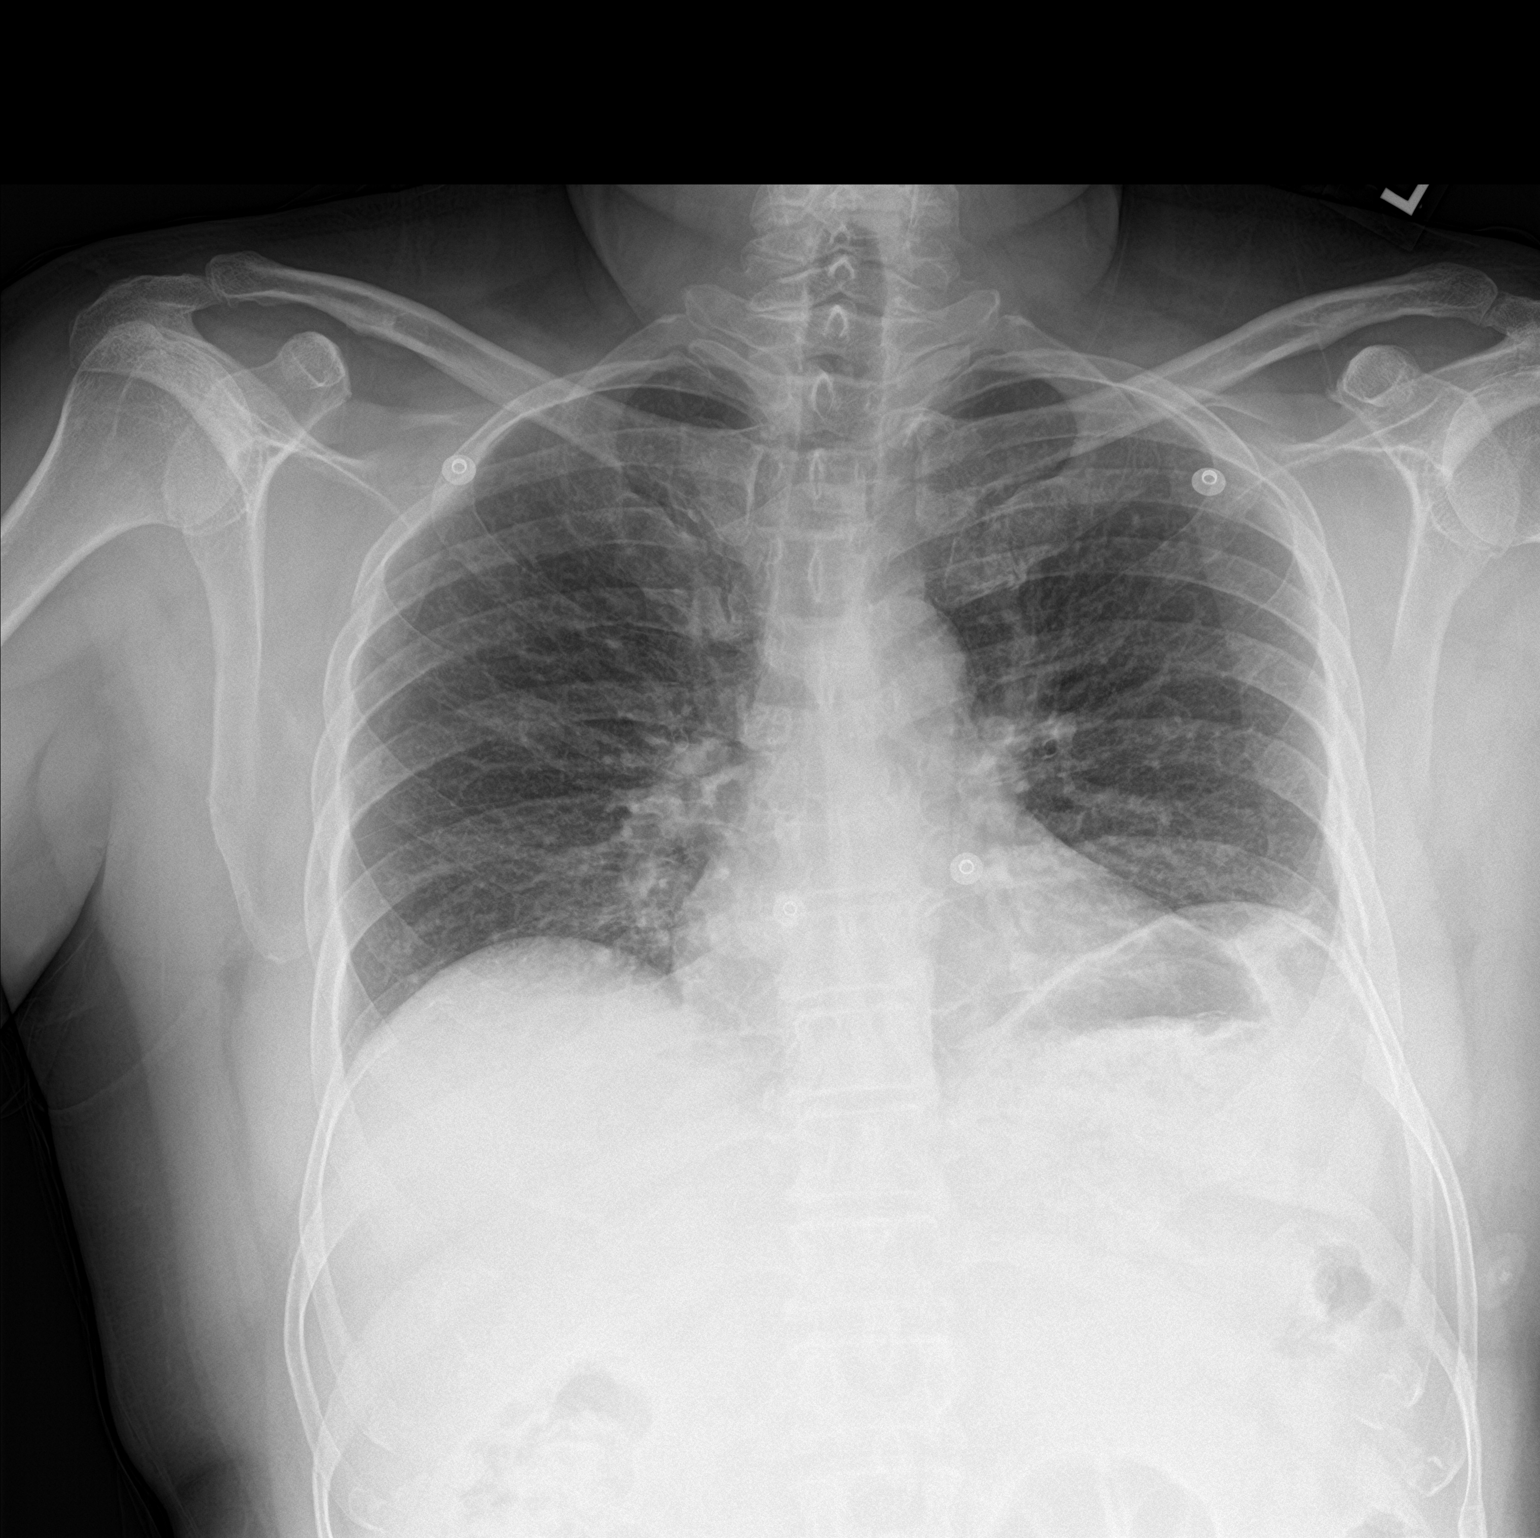

[chest lat]
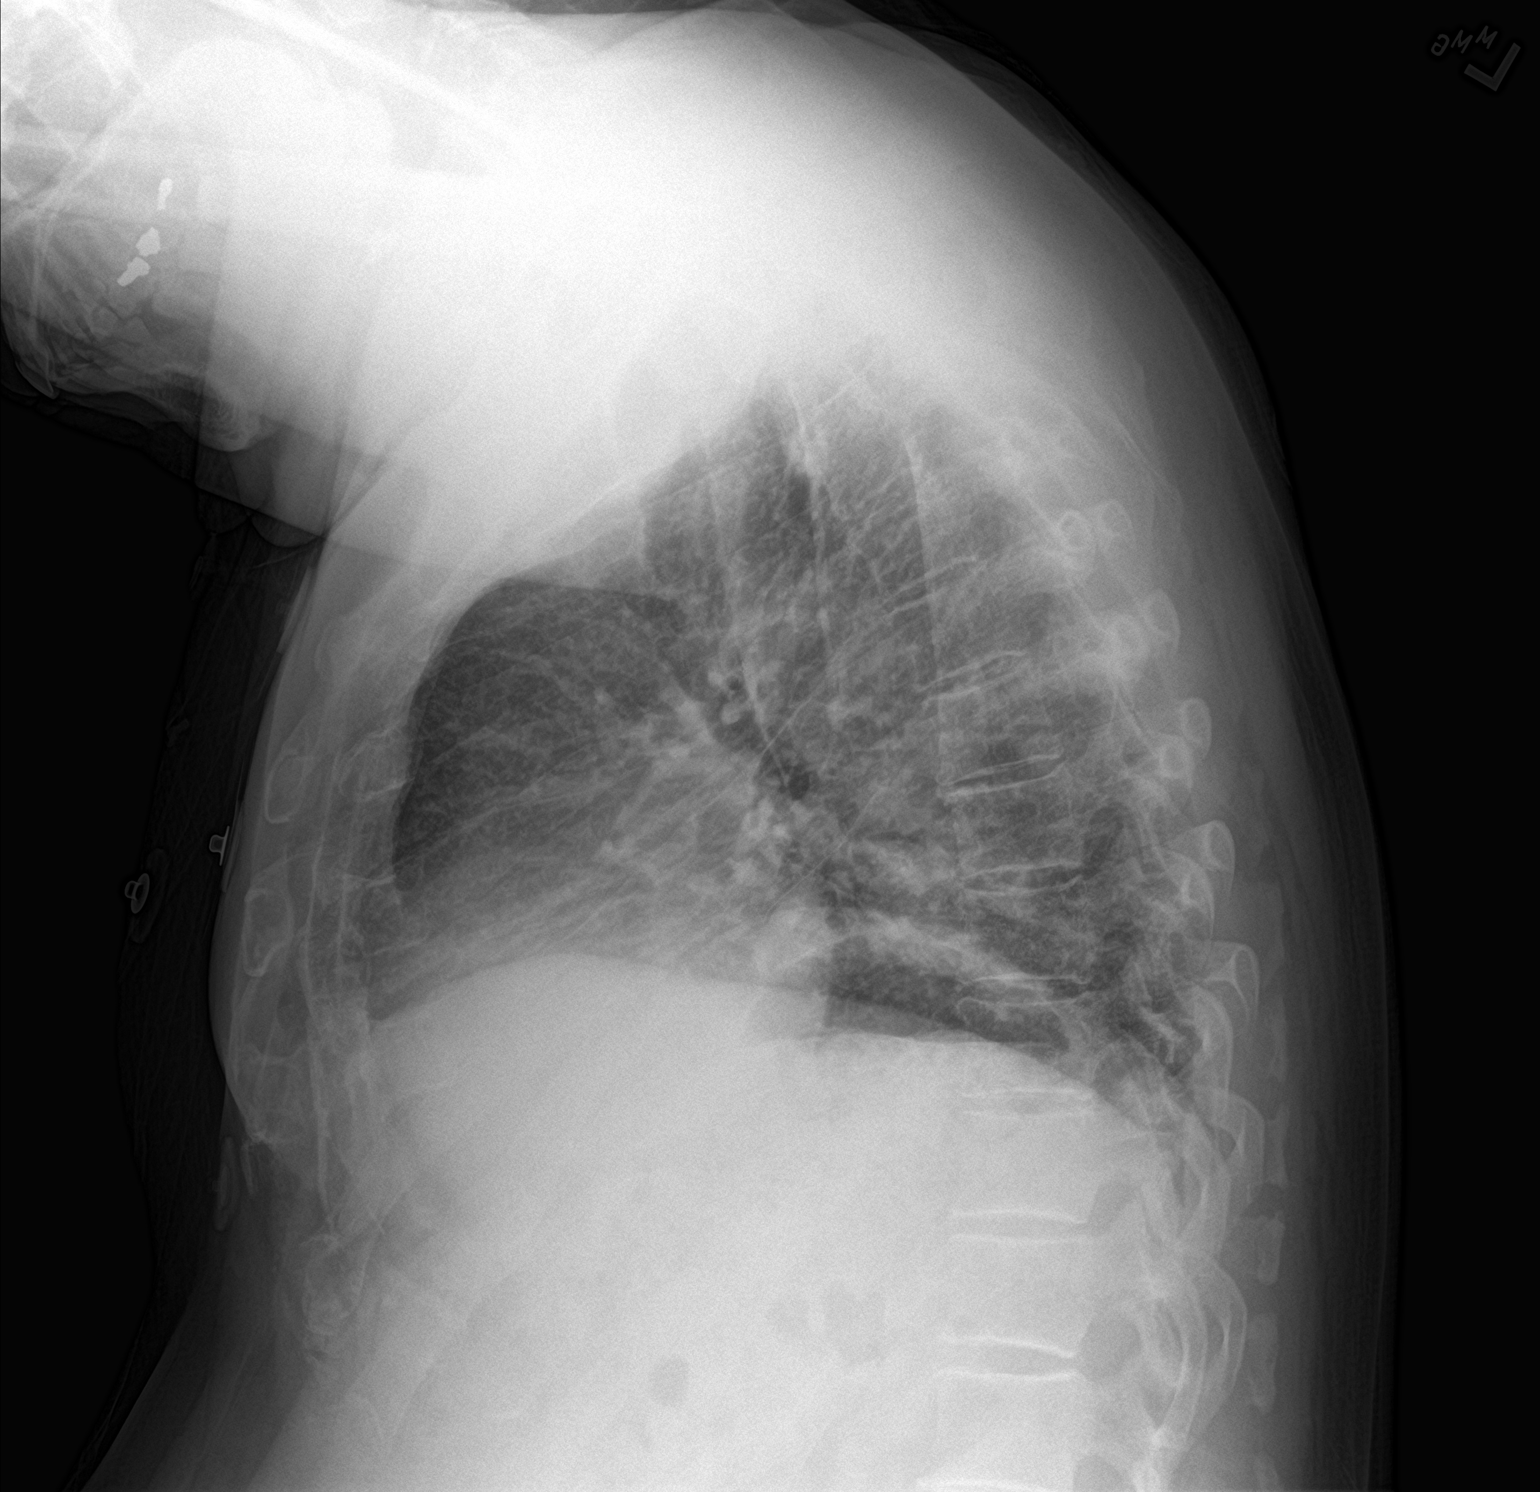

[2 of 2 positions shown; findings below may reference images not displayed]

FINDINGS: Cardiomediastinal silhouette is unremarkable for this low
inspiratory examination with crowded vasculature markings. The lungs
are clear without pleural effusions or focal consolidations.
Bronchitic changes. Trachea projects midline and there is no
pneumothorax. Included soft tissue planes and osseous structures are
non-suspicious.
IMPRESSION: Bronchitic changes without focal consolidation.
